# Patient Record
Sex: Female | Born: 1980 | Race: Black or African American | Hispanic: No | Marital: Single | Smoking: Current every day smoker
Health system: Southern US, Community
[De-identification: ages and names within clinical notes are randomized; demographics above are authoritative.]

## PROBLEM LIST (undated history)

## (undated) DIAGNOSIS — E669 Obesity, unspecified: Secondary | ICD-10-CM

## (undated) HISTORY — PX: ABDOMINAL HYSTERECTOMY: SHX81

## (undated) HISTORY — DX: Obesity, unspecified: E66.9

---
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
## 2002-10-04 ENCOUNTER — Emergency Department (HOSPITAL_COMMUNITY): Admission: EM | Admit: 2002-10-04 | Discharge: 2002-10-04 | Payer: Self-pay | Admitting: Emergency Medicine

## 2006-05-01 ENCOUNTER — Emergency Department (HOSPITAL_COMMUNITY): Admission: EM | Admit: 2006-05-01 | Discharge: 2006-05-01 | Payer: Self-pay | Admitting: Emergency Medicine

## 2006-05-31 ENCOUNTER — Emergency Department (HOSPITAL_COMMUNITY): Admission: EM | Admit: 2006-05-31 | Discharge: 2006-05-31 | Payer: Self-pay | Admitting: Emergency Medicine

## 2006-05-31 ENCOUNTER — Emergency Department (HOSPITAL_COMMUNITY): Admission: EM | Admit: 2006-05-31 | Discharge: 2006-05-31 | Payer: Self-pay | Admitting: Family Medicine

## 2006-12-16 ENCOUNTER — Emergency Department (HOSPITAL_COMMUNITY): Admission: EM | Admit: 2006-12-16 | Discharge: 2006-12-16 | Payer: Self-pay | Admitting: Emergency Medicine

## 2007-04-20 ENCOUNTER — Inpatient Hospital Stay (HOSPITAL_COMMUNITY): Admission: AD | Admit: 2007-04-20 | Discharge: 2007-04-20 | Payer: Self-pay | Admitting: Obstetrics

## 2007-04-20 ENCOUNTER — Other Ambulatory Visit: Payer: Self-pay | Admitting: Family Medicine

## 2007-08-06 ENCOUNTER — Inpatient Hospital Stay (HOSPITAL_COMMUNITY): Admission: AD | Admit: 2007-08-06 | Discharge: 2007-08-06 | Payer: Self-pay | Admitting: Obstetrics

## 2007-08-12 ENCOUNTER — Inpatient Hospital Stay (HOSPITAL_COMMUNITY): Admission: AD | Admit: 2007-08-12 | Discharge: 2007-08-12 | Payer: Self-pay | Admitting: Obstetrics

## 2007-08-20 ENCOUNTER — Inpatient Hospital Stay (HOSPITAL_COMMUNITY): Admission: RE | Admit: 2007-08-20 | Discharge: 2007-08-23 | Payer: Self-pay | Admitting: Obstetrics

## 2008-05-23 ENCOUNTER — Emergency Department (HOSPITAL_COMMUNITY): Admission: EM | Admit: 2008-05-23 | Discharge: 2008-05-24 | Payer: Self-pay | Admitting: Emergency Medicine

## 2009-01-23 ENCOUNTER — Emergency Department (HOSPITAL_BASED_OUTPATIENT_CLINIC_OR_DEPARTMENT_OTHER): Admission: EM | Admit: 2009-01-23 | Discharge: 2009-01-23 | Payer: Self-pay | Admitting: Emergency Medicine

## 2009-08-04 ENCOUNTER — Emergency Department (HOSPITAL_BASED_OUTPATIENT_CLINIC_OR_DEPARTMENT_OTHER): Admission: EM | Admit: 2009-08-04 | Discharge: 2009-08-04 | Payer: Self-pay | Admitting: Emergency Medicine

## 2009-08-04 ENCOUNTER — Ambulatory Visit: Payer: Self-pay | Admitting: Diagnostic Radiology

## 2010-09-30 LAB — URINALYSIS, ROUTINE W REFLEX MICROSCOPIC
Glucose, UA: NEGATIVE mg/dL
Protein, ur: NEGATIVE mg/dL
Specific Gravity, Urine: 1.014 (ref 1.005–1.030)
Urobilinogen, UA: 1 mg/dL (ref 0.0–1.0)

## 2010-09-30 LAB — WET PREP, GENITAL
Trich, Wet Prep: NONE SEEN
WBC, Wet Prep HPF POC: NONE SEEN

## 2010-09-30 LAB — PREGNANCY, URINE: Preg Test, Ur: NEGATIVE

## 2010-09-30 LAB — GC/CHLAMYDIA PROBE AMP, GENITAL: Chlamydia, DNA Probe: NEGATIVE

## 2010-10-05 ENCOUNTER — Emergency Department (HOSPITAL_BASED_OUTPATIENT_CLINIC_OR_DEPARTMENT_OTHER)
Admission: EM | Admit: 2010-10-05 | Discharge: 2010-10-05 | Disposition: A | Discharge status: Home / Self Care | Payer: Medicaid Other | Attending: Emergency Medicine | Admitting: Emergency Medicine

## 2010-10-05 DIAGNOSIS — J029 Acute pharyngitis, unspecified: Secondary | ICD-10-CM | POA: Insufficient documentation

## 2010-10-05 DIAGNOSIS — F17200 Nicotine dependence, unspecified, uncomplicated: Secondary | ICD-10-CM | POA: Insufficient documentation

## 2010-10-05 LAB — RAPID STREP SCREEN (MED CTR MEBANE ONLY): Streptococcus, Group A Screen (Direct): NEGATIVE

## 2010-11-07 NOTE — Op Note (Signed)
NAME:  Misty Huber, Misty Huber              ACCOUNT NO.:  [id number]   MEDICAL RECORD NO.:  [id number]          PATIENT TYPE:  INP   LOCATION:  9134                          FACILITY:  [facility]   PHYSICIAN:  Kathreen Cosier, M.D.DATE OF BIRTH:  14-Jan-1981   DATE OF PROCEDURE:  08/20/2007  DATE OF DISCHARGE:  08/12/2007                               OPERATIVE REPORT   PREOPERATIVE DIAGNOSIS:  Previous cesarean section at term, desires  repeat   POSTOPERATIVE DIAGNOSIS:  Previous cesarean section at term, desires  repeat   SURGEON:  Kathreen Cosier, M.D.   FIRST ASSISTANT:  Charles A. Clearance Coots, M.D.   ANESTHESIA:  Spinal.   PROCEDURE:  The patient was placed on the operating table in the supine  position after spinal was administered.  The abdomen was prepped and  draped, the bladder emptied with a Foley catheter.  A transverse  suprapubic incision was made and carried down to the rectus fascia.  The  fascia was cleaned and incised the length of the incision.  The recti  muscles were retracted laterally.  The peritoneum was incised  longitudinally.  A transverse incision was made in the visceral  peritoneum above the bladder and the bladder mobilized inferiorly.  A  transverse lower uterine incision was made, fluid clear.  The patient  delivered from the LOA position of a female, Apgar 9/9, weighing 6  pounds 3 ounces, the team was in attendance.  The placenta was fundal  and removed manually.  The uterine cavity was cleaned with dry laps.  The uterine incision was closed in one layer with continuous suture of  #1 chromic.  Hemostasis was satisfactory.  The bladder flap was  reattached with 2-0 chromic.  The uterus was well contracted.  The tubes  and ovaries were normal.  The abdomen was closed in layers, the  peritoneum with continuous suture of 0 chromic, the fascia with  continuous suture of #1 Dexon, the skin was closed with subcuticular  stitch of 4-0 Monocryl.  Blood loss 800 mL.   The patient tolerated the  procedure well and was taken to the recovery room in good condition.           ______________________________  Kathreen Cosier, M.D.     BAM/MEDQ  D:  08/20/2007  T:  08/20/2007  Job:  [id number]

## 2010-11-10 NOTE — Discharge Summary (Signed)
NAME:  Misty Huber, COSTILOW NO.:  [id number]   MEDICAL RECORD NO.:  [id number]          PATIENT TYPE:  INP   LOCATION:  9134                          FACILITY:  [facility]   PHYSICIAN:  Kathreen Cosier, M.D.DATE OF BIRTH:  10/21/1980   DATE OF ADMISSION:  08/20/2007  DATE OF DISCHARGE:  08/23/2007                               DISCHARGE SUMMARY   The patient is a 30 year old gravida 4, para 1-2-0-2, [facility] August 28, 2007.  She had a C-section in the past and was now at term and desired repeat C-  section.  She had a negative GBS, HIV, and RPR.  The patient underwent  repeat low-transverse cesarean section on August 20, 2007, she had a  female, Apgar 9 and 9, weighing 6 pounds 3 ounces.  Postop, she did  well.  On admission, her hemoglobin was 10.2, postop 9.1; platelet was  274 and 224; white count 10.1 and  9.9.  RPR negative.  HIV negative.  She was discharged on the 3rd postoperative day, ambulatory, on a  regular diet, and Tylox for pain and ferrous sulfate for anemia.   DISCHARGE DIAGNOSIS:  Status post repeat low-transverse cesarean at term  because of previous C-section.           ______________________________  Kathreen Cosier, M.D.     BAM/MEDQ  D:  09/09/2007  T:  09/10/2007  Job:  [id number]

## 2011-03-16 LAB — CBC
HCT: 29.7 — ABNORMAL LOW
MCHC: 34.3
MCHC: 34.4
MCV: 92.8
Platelets: 274
RBC: 2.83 — ABNORMAL LOW
RDW: 14.1
WBC: 10.1
WBC: 9.9

## 2011-03-16 LAB — RPR: RPR Ser Ql: NONREACTIVE

## 2011-04-04 LAB — POCT URINALYSIS DIP (DEVICE)
Glucose, UA: NEGATIVE
Hgb urine dipstick: NEGATIVE
Protein, ur: 30 — AB
Urobilinogen, UA: 2 — ABNORMAL HIGH
pH: 6

## 2011-04-04 LAB — URINALYSIS, ROUTINE W REFLEX MICROSCOPIC
Protein, ur: NEGATIVE
Urobilinogen, UA: 0.2

## 2011-04-11 LAB — POCT PREGNANCY, URINE
Operator id: 235561
Preg Test, Ur: POSITIVE

## 2011-04-11 LAB — POCT URINALYSIS DIP (DEVICE)
Glucose, UA: NEGATIVE
Operator id: 235561
Specific Gravity, Urine: 1.02
Urobilinogen, UA: 1
pH: 6.5

## 2011-04-11 LAB — WET PREP, GENITAL
Trich, Wet Prep: NONE SEEN
Yeast Wet Prep HPF POC: NONE SEEN

## 2011-04-11 LAB — GC/CHLAMYDIA PROBE AMP, GENITAL
Chlamydia, DNA Probe: NEGATIVE
GC Probe Amp, Genital: NEGATIVE

## 2011-11-10 ENCOUNTER — Emergency Department (HOSPITAL_BASED_OUTPATIENT_CLINIC_OR_DEPARTMENT_OTHER): Payer: Medicaid Other

## 2011-11-10 ENCOUNTER — Encounter (HOSPITAL_BASED_OUTPATIENT_CLINIC_OR_DEPARTMENT_OTHER): Payer: Self-pay | Admitting: *Deleted

## 2011-11-10 ENCOUNTER — Emergency Department (HOSPITAL_BASED_OUTPATIENT_CLINIC_OR_DEPARTMENT_OTHER)
Admission: EM | Admit: 2011-11-10 | Discharge: 2011-11-10 | Disposition: A | Discharge status: Home / Self Care | Payer: Medicaid Other | Attending: Emergency Medicine | Admitting: Emergency Medicine

## 2011-11-10 DIAGNOSIS — W2203XA Walked into furniture, initial encounter: Secondary | ICD-10-CM | POA: Insufficient documentation

## 2011-11-10 DIAGNOSIS — S60229A Contusion of unspecified hand, initial encounter: Secondary | ICD-10-CM | POA: Insufficient documentation

## 2011-11-10 MED ORDER — IBUPROFEN 400 MG PO TABS
600.0000 mg | ORAL_TABLET | Freq: Once | ORAL | Status: AC
  Administered 2011-11-10: 600 mg via ORAL
  Filled 2011-11-10: qty 1

## 2011-11-10 NOTE — ED Notes (Signed)
Pt states she hit her right hand on the table. C/O pain to same. Wrapped PTA

## 2011-11-10 NOTE — ED Provider Notes (Signed)
History   This chart was scribed for Suzi Roots, MD by Shari Heritage. The patient was seen in room MHH2/[facility]2. Patient's care was started at 52.     CSN: [id number]  Arrival date & time 11/10/11  3086   First MD Initiated Contact with Patient 11/10/11 2012      Chief Complaint  Patient presents with  . Hand Injury    (Consider location/radiation/quality/duration/timing/severity/associated sxs/prior treatment) The history is provided by the patient. No language interpreter was used.   Misty Huber is a 31 y.o. female who presents to the Emergency Department complaining of moderate, constant pain in right hand with an associated sharp pain that shoots up her right arm after patient hit her hand on a table this evening. Pain worsens with palpation. Patient has a history of fracture in right hand. Patient has no other symptoms. Skin in tact. Pt is ambulatory. No numbness or loss of rom. No other injury.   No fever/chills. No nausea/vomiting. Pt is ambulatory. Patient is current everyday smoker.  History reviewed. No pertinent past medical history.  History reviewed. No pertinent past surgical history.  History reviewed. No pertinent family history.  History  Substance Use Topics  . Smoking status: Current Everyday Smoker  . Smokeless tobacco: Not on file  . Alcohol Use: No    OB History    Grav Para Term Preterm Abortions TAB SAB Ect Mult Living                  Review of Systems  Constitutional: Negative for fever.  Musculoskeletal:       Pain in right hand and sharp pain shooting up right arm.  Skin: Negative for wound.  Neurological: Negative for numbness.    Allergies  Review of patient's allergies indicates not on file.  Home Medications  No current outpatient prescriptions on file.  BP 112/58  Pulse 95  Temp(Src) 99.2 F (37.3 C) (Oral)  Resp 20  Ht 5\' 2"  (1.575 m)  Wt 200 lb (90.719 kg)  BMI 36.58 kg/m2  SpO2 100%  Physical Exam  Nursing  note and vitals reviewed. Constitutional: She appears well-developed and well-nourished.  HENT:  Head: Normocephalic and atraumatic.  Eyes: Conjunctivae and EOM are normal. Pupils are equal, round, and reactive to light.  Neck: Normal range of motion. Neck supple.  Cardiovascular: Normal rate and regular rhythm.   Pulmonary/Chest: Effort normal and breath sounds normal.  Abdominal: Soft. Bowel sounds are normal.  Musculoskeletal: Normal range of motion. She exhibits tenderness (Right hand).       Tenderness lateral aspect right hand. Skin intact. No scaphoid tenderness or wrist tenderness. Normal tendon fxn. Radial pulse 2+. Normal cap refill distally. Normal rom.   Neurological: She is alert.       Hand nvi.   Skin: Skin is warm and dry.  Psychiatric: She has a normal mood and affect.    ED Course  Procedures (including critical care time) DIAGNOSTIC STUDIES: Oxygen Saturation is 100% on room air, normal by my interpretation.    COORDINATION OF CARE: 9:10pm- Patient informed of current plan for treatment and evaluation and agrees with plan at this time.     Labs Reviewed - No data to display Dg Hand Complete Right  11/10/2011  *RADIOLOGY REPORT*  Clinical Data: Injury.  Pain   fifth finger.  RIGHT HAND - COMPLETE 3+ VIEW  Comparison: 05/23/2008.  Findings:  Chronic healed fracture base of the fifth metacarpal. No acute fracture or arthropathy.  IMPRESSION: No acute abnormality.  Original Report Authenticated By: Camelia Phenes, M.D.       MDM  Xrays. Motrin po.  Discussed xrays w pt.      I personally performed the services described in this documentation, which was scribed in my presence. The recorded information has been reviewed and considered. Suzi Roots, MD     Suzi Roots, MD 11/10/11 2130

## 2011-11-10 NOTE — Discharge Instructions (Signed)
Take motrin or aleve as need.  Follow up with primary care doctor in 1-2 weeks if symptoms fail to improve/resolve.  Return if worse, new symptoms, other concern.       Contusion (Bruise) of Hand An injury to the hand may cause bruises (contusions). Contusions are caused by bleeding from small blood vessels (capillaries) that allow blood to leak out into the muscles, tendons, and surrounding soft tissue. This is followed by swelling and pain (inflammation). Contusions of the hand are common because of the use of hands in daily and recreational activities. Signs of a hand injury include pain, swelling, and a color change. Initially the skin may turn blue to purple in color. As the bruise ages, the color turns yellow and orange. Swelling may decrease the movement of the fingers. Contusions are seen more commonly with:  Contact sports (especially in football, wrestling, and basketball).   Use of medications that thin the blood (anticoagulants).   Use of aspirin and nonsteroidal anti-inflammatory agents that decrease the ability of the blood to clot.   Vitamin deficiencies.   Aging.  DIAGNOSIS  Diagnosis of hand injuries can be made by your own observation. If problems continue, a caregiver may be required for further evaluation and treatment. X-rays may be required to make sure there are no broken bones (fractures). Continued problems may require physical therapy for treatment. RISKS AND COMPLICATIONS  Extensive bleeding and tissue inflammation. This can lead to disability and arthritis-type problems later on if the hand does not heal properly.   Infection of the hand if there are breaks in the skin. This is especially true if the hand injury came from someone's teeth, such as would occur with punching someone in the mouth. This can lead to an infection of the tendons and the membranes surrounding the tendons (sheaths). This infection can have severe complications including a loss of function  (a "frozen" hand).   Rupture of the tendons requiring a surgical repair. Failure to repair the tendons can result in loss of function of the hand or fingers.  HOME CARE INSTRUCTIONS   Apply ice to the injury for 15 to 20 minutes, 3 to 4 times per day. Put the ice in a plastic bag and place a towel between the bag of ice and your skin.   An elastic bandage may be used initially for support and to minimize swelling. Do not wrap the hand too tightly. Do not sleep with the elastic bandage on.   Gentle massage from the fingertips towards the elbow will help keep the swelling down. Gently open and close your fist while doing this to maintain range of motion. Do this only after the first few days, when there is no or minimal pain.   Keep your hand above the level of the heart when swelling and pain are present. This will allow the fluid to drain out of the hand, decreasing the amount of swelling. This will improve healing time.   Try to avoid use of the injured hand (except for gentle range of motion) while the hand is hurting. Do not resume use until instructed by your caregiver. Then begin use gradually, do not increase use to the point of pain. If pain does develop, decrease use and continue the above measures, gradually increasing activities that do not cause discomfort until you achieve normal use.   Only take over-the-counter or prescription medicines for pain, discomfort, or fever as directed by your caregiver.   Follow up with your caregiver as  directed. Follow-up care may include orthopedic referrals, physical therapy, and rehabilitation. Any delay in obtaining necessary care could result in delayed healing, or temporary or permanent disability.  REHABILITATION  Begin daily rehabilitation exercises when an elastic bandage is no longer needed and you are either pain free or only have minimal pain.   Use ice massage for 10 minutes before and after workouts. Put ice in a plastic bag and place a  towel between the bag of ice and your skin. Massage the injured area with the ice pack.  SEEK IMMEDIATE MEDICAL CARE IF:   Your pain and swelling increase, or pain is uncontrolled with medications.   You have loss of feeling in your hand, or your hand turns cold or blue.   An oral temperature above 102 F (38.9 C) develops, not controlled by medication.   Your hand becomes warm to the touch, or you have increased pain with even slight movement of your fingers.   Your hand does not begin to improve in 1 or 2 days.   The skin is broken and signs of infection occur (fluid draining from the contusion, increasing pain, fever, headache, muscle aches, dizziness, or a general ill feeling).   You develop new, unexplained problems, or an increase of the symptoms that brought you to your caregiver.  MAKE SURE YOU:   Understand these instructions.   Will watch your condition.   Will get help right away if you are not doing well or get worse.  Document Released: 12/01/2001 Document Revised: 05/31/2011 Document Reviewed: 11/18/2009 [facility] Patient Information 2012 [place], [place].      Cryotherapy Cryotherapy means treatment with cold. Ice or gel packs can be used to reduce both pain and swelling. Ice is the most helpful within the first 24 to 48 hours after an injury or flareup from overusing a muscle or joint. Sprains, strains, spasms, burning pain, shooting pain, and aches can all be eased with ice. Ice can also be used when recovering from surgery. Ice is effective, has very few side effects, and is safe for most people to use. PRECAUTIONS  Ice is not a safe treatment option for people with:  Raynaud's phenomenon. This is a condition affecting small blood vessels in the extremities. Exposure to cold may cause your problems to return.   Cold hypersensitivity. There are many forms of cold hypersensitivity, including:   Cold urticaria. Red, itchy hives appear on the skin when the tissues  begin to warm after being iced.   Cold erythema. This is a red, itchy rash caused by exposure to cold.   Cold hemoglobinuria. Red blood cells break down when the tissues begin to warm after being iced. The hemoglobin that carry oxygen are passed into the urine because they cannot combine with blood proteins fast enough.   Numbness or altered sensitivity in the area being iced.  If you have any of the following conditions, do not use ice until you have discussed cryotherapy with your caregiver:  Heart conditions, such as arrhythmia, angina, or chronic heart disease.   High blood pressure.   Healing wounds or open skin in the area being iced.   Current infections.   Rheumatoid arthritis.   Poor circulation.   Diabetes.  Ice slows the blood flow in the region it is applied. This is beneficial when trying to stop inflamed tissues from spreading irritating chemicals to surrounding tissues. However, if you expose your skin to cold temperatures for too long or without the proper protection, you  can damage your skin or nerves. Watch for signs of skin damage due to cold. HOME CARE INSTRUCTIONS Follow these tips to use ice and cold packs safely.  Place a dry or damp towel between the ice and skin. A damp towel will cool the skin more quickly, so you may need to shorten the time that the ice is used.   For a more rapid response, add gentle compression to the ice.   Ice for no more than 10 to 20 minutes at a time. The bonier the area you are icing, the less time it will take to get the benefits of ice.   Check your skin after 5 minutes to make sure there are no signs of a poor response to cold or skin damage.   Rest 20 minutes or more in between uses.   Once your skin is numb, you can end your treatment. You can test numbness by very lightly touching your skin. The touch should be so light that you do not see the skin dimple from the pressure of your fingertip. When using ice, most people  will feel these normal sensations in this order: cold, burning, aching, and numbness.   Do not use ice on someone who cannot communicate their responses to pain, such as small children or people with dementia.  HOW TO MAKE AN ICE PACK Ice packs are the most common way to use ice therapy. Other methods include ice massage, ice baths, and cryo-sprays. Muscle creams that cause a cold, tingly feeling do not offer the same benefits that ice offers and should not be used as a substitute unless recommended by your caregiver. To make an ice pack, do one of the following:  Place crushed ice or a bag of frozen vegetables in a sealable plastic bag. Squeeze out the excess air. Place this bag inside another plastic bag. Slide the bag into a pillowcase or place a damp towel between your skin and the bag.   Mix 3 parts water with 1 part rubbing alcohol. Freeze the mixture in a sealable plastic bag. When you remove the mixture from the freezer, it will be slushy. Squeeze out the excess air. Place this bag inside another plastic bag. Slide the bag into a pillowcase or place a damp towel between your skin and the bag.  SEEK MEDICAL CARE IF:  You develop white spots on your skin. This may give the skin a blotchy (mottled) appearance.   Your skin turns blue or pale.   Your skin becomes waxy or hard.   Your swelling gets worse.  MAKE SURE YOU:   Understand these instructions.   Will watch your condition.   Will get help right away if you are not doing well or get worse.  Document Released: 02/05/2011 Document Revised: 05/31/2011 Document Reviewed: 02/05/2011 [facility] Patient Information 2012 [place], [place].

## 2011-11-10 NOTE — ED Notes (Signed)
Pt has ice pack for home use

## 2012-03-10 ENCOUNTER — Emergency Department (HOSPITAL_BASED_OUTPATIENT_CLINIC_OR_DEPARTMENT_OTHER)
Admission: EM | Admit: 2012-03-10 | Discharge: 2012-03-10 | Disposition: A | Discharge status: Home / Self Care | Payer: Self-pay | Attending: Emergency Medicine | Admitting: Emergency Medicine

## 2012-03-10 ENCOUNTER — Encounter (HOSPITAL_BASED_OUTPATIENT_CLINIC_OR_DEPARTMENT_OTHER): Payer: Self-pay | Admitting: *Deleted

## 2012-03-10 DIAGNOSIS — Z91040 Latex allergy status: Secondary | ICD-10-CM | POA: Insufficient documentation

## 2012-03-10 DIAGNOSIS — L0390 Cellulitis, unspecified: Secondary | ICD-10-CM

## 2012-03-10 DIAGNOSIS — L02419 Cutaneous abscess of limb, unspecified: Secondary | ICD-10-CM | POA: Insufficient documentation

## 2012-03-10 DIAGNOSIS — Z91013 Allergy to seafood: Secondary | ICD-10-CM | POA: Insufficient documentation

## 2012-03-10 MED ORDER — SULFAMETHOXAZOLE-TMP DS 800-160 MG PO TABS
2.0000 | ORAL_TABLET | Freq: Once | ORAL | Status: AC
  Administered 2012-03-10: 2 via ORAL
  Filled 2012-03-10: qty 2

## 2012-03-10 MED ORDER — CEPHALEXIN 500 MG PO CAPS: 500.0000 mg | ORAL_CAPSULE | Freq: Four times a day (QID) | ORAL | Status: DC

## 2012-03-10 MED ORDER — CEPHALEXIN 250 MG PO CAPS
500.0000 mg | ORAL_CAPSULE | Freq: Once | ORAL | Status: AC
  Administered 2012-03-10: 500 mg via ORAL
  Filled 2012-03-10: qty 2

## 2012-03-10 MED ORDER — OXYCODONE-ACETAMINOPHEN 5-325 MG PO TABS: 1.0000 | ORAL_TABLET | ORAL | Status: DC | PRN

## 2012-03-10 MED ORDER — SULFAMETHOXAZOLE-TRIMETHOPRIM 800-160 MG PO TABS: 2.0000 | ORAL_TABLET | Freq: Two times a day (BID) | ORAL | Status: DC

## 2012-03-10 MED ORDER — OXYCODONE-ACETAMINOPHEN 5-325 MG PO TABS
2.0000 | ORAL_TABLET | Freq: Once | ORAL | Status: AC
  Administered 2012-03-10: 2 via ORAL
  Filled 2012-03-10 (×2): qty 2

## 2012-03-10 NOTE — ED Notes (Signed)
Rash, redness, swelling and pain to her right lower leg since yesterday. Chills.

## 2012-03-10 NOTE — ED Notes (Signed)
Circled the redness on the lower R leg.

## 2012-03-10 NOTE — ED Provider Notes (Signed)
History     CSN: [id number]  Arrival date & time 03/10/12  1156   First MD Initiated Contact with Patient 03/10/12 1307      Chief Complaint  Patient presents with  . Rash    (Consider location/radiation/quality/duration/timing/severity/associated sxs/prior treatment) HPI  31 y.o. female in no acute distress complaining of redness, warmth and swelling to right lower extremity onset yesterday evening. Patient reports chills but denies any fever, nausea or vomiting. Patient cannot recall anything that would've compromised the integrity of the skin.  History reviewed. No pertinent past medical history.  History reviewed. No pertinent past surgical history.  No family history on file.  History  Substance Use Topics  . Smoking status: Current Every Day Smoker -- 0.5 packs/day  . Smokeless tobacco: Not on file  . Alcohol Use: Yes    OB History    Grav Para Term Preterm Abortions TAB SAB Ect Mult Living                  Review of Systems  Constitutional: Negative for fever.  Respiratory: Negative for shortness of breath.   Cardiovascular: Negative for chest pain.  Gastrointestinal: Negative for nausea, vomiting, abdominal pain and diarrhea.  Skin: Positive for rash.  All other systems reviewed and are negative.    Allergies  Shellfish allergy and Latex  Home Medications   Current Outpatient Rx  Name Route Sig Dispense Refill  . ALUM & MAG HYDROXIDE-SIMETH 500-450-40 MG/5ML PO SUSP Oral Take 15 mLs by mouth every 6 (six) hours as needed. For indigestion    . DIPHENHYDRAMINE HCL 25 MG PO TABS Oral Take 25 mg by mouth every 6 (six) hours as needed. For allergies    . ETONOGESTREL 68 MG [place] IMPL Subcutaneous Inject 1 each into the skin once. Implanted 2009    . IBUPROFEN 200 MG PO TABS Oral Take 800 mg by mouth every 8 (eight) hours as needed. For pain      BP 115/76  Pulse 82  Temp 97.9 F (36.6 C) (Oral)  Resp 20  SpO2 100%  Physical Exam  Nursing note and  vitals reviewed. Constitutional: She is oriented to person, place, and time. She appears well-developed and well-nourished. No distress.  HENT:  Head: Normocephalic.  Eyes: Conjunctivae normal and EOM are normal.  Cardiovascular: Normal rate.   Pulmonary/Chest: Effort normal. No stridor.  Abdominal: Soft. Bowel sounds are normal.  Musculoskeletal: Normal range of motion.  Neurological: She is alert and oriented to person, place, and time.  Skin:       6 x 10 cm area of induration to right lower extremity on anterior medial side. Is warm, indurated, and exquisitely tender to palpation. There are no areas of fluctuance.  Psychiatric: She has a normal mood and affect.    ED Course  Procedures (including critical care time)  Labs Reviewed - No data to display No results found.   1. Cellulitis       MDM   31 y.o. -year-old female with cellulitis to the right lower extremity I will start her on Bactrim Keflex and patient verbalized her understanding of return precautions. We'll also have the area of induration and outlined so the patient can track if the redness is expanding.  New Prescriptions   CEPHALEXIN (KEFLEX) 500 MG CAPSULE    Take 1 capsule (500 mg total) by mouth 4 (four) times daily.   OXYCODONE-ACETAMINOPHEN (PERCOCET) 5-325 MG PER TABLET    Take 1 tablet by mouth every  4 (four) hours as needed for pain.   SULFAMETHOXAZOLE-TRIMETHOPRIM (BACTRIM DS) 800-160 MG PER TABLET    Take 2 tablets by mouth 2 (two) times daily.          Wynetta Emery, PA-C 03/10/12 1359

## 2012-03-11 NOTE — ED Provider Notes (Signed)
Medical screening examination/treatment/procedure(s) were performed by non-physician practitioner and as supervising physician I was immediately available for consultation/collaboration.  Mattew Chriswell, MD 03/11/12 0714 

## 2013-04-05 ENCOUNTER — Encounter (HOSPITAL_BASED_OUTPATIENT_CLINIC_OR_DEPARTMENT_OTHER): Payer: Self-pay | Admitting: Emergency Medicine

## 2013-04-05 ENCOUNTER — Emergency Department (HOSPITAL_BASED_OUTPATIENT_CLINIC_OR_DEPARTMENT_OTHER)
Admission: EM | Admit: 2013-04-05 | Discharge: 2013-04-05 | Disposition: A | Discharge status: Home / Self Care | Payer: Medicaid Other | Attending: Emergency Medicine | Admitting: Emergency Medicine

## 2013-04-05 DIAGNOSIS — R1110 Vomiting, unspecified: Secondary | ICD-10-CM | POA: Insufficient documentation

## 2013-04-05 DIAGNOSIS — Z91040 Latex allergy status: Secondary | ICD-10-CM | POA: Insufficient documentation

## 2013-04-05 DIAGNOSIS — R509 Fever, unspecified: Secondary | ICD-10-CM | POA: Insufficient documentation

## 2013-04-05 DIAGNOSIS — F17200 Nicotine dependence, unspecified, uncomplicated: Secondary | ICD-10-CM | POA: Insufficient documentation

## 2013-04-05 DIAGNOSIS — Z3202 Encounter for pregnancy test, result negative: Secondary | ICD-10-CM | POA: Insufficient documentation

## 2013-04-05 DIAGNOSIS — J329 Chronic sinusitis, unspecified: Secondary | ICD-10-CM | POA: Insufficient documentation

## 2013-04-05 LAB — URINALYSIS, ROUTINE W REFLEX MICROSCOPIC
Protein, ur: NEGATIVE mg/dL
Specific Gravity, Urine: 1.025 (ref 1.005–1.030)
Urobilinogen, UA: 1 mg/dL (ref 0.0–1.0)

## 2013-04-05 LAB — URINE MICROSCOPIC-ADD ON

## 2013-04-05 MED ORDER — BENZONATATE 100 MG PO CAPS
100.0000 mg | ORAL_CAPSULE | Freq: Once | ORAL | Status: AC
  Administered 2013-04-05: 100 mg via ORAL
  Filled 2013-04-05: qty 1

## 2013-04-05 MED ORDER — ONDANSETRON 4 MG PO TBDP
4.0000 mg | ORAL_TABLET | Freq: Once | ORAL | Status: AC
  Administered 2013-04-05: 4 mg via ORAL
  Filled 2013-04-05: qty 1

## 2013-04-05 MED ORDER — KETOROLAC TROMETHAMINE 30 MG/ML IJ SOLN
30.0000 mg | Freq: Once | INTRAMUSCULAR | Status: DC
  Filled 2013-04-05: qty 1

## 2013-04-05 MED ORDER — KETOROLAC TROMETHAMINE 60 MG/2ML IM SOLN
60.0000 mg | Freq: Once | INTRAMUSCULAR | Status: AC
  Administered 2013-04-05: 60 mg via INTRAMUSCULAR
  Filled 2013-04-05: qty 2

## 2013-04-05 MED ORDER — SODIUM CHLORIDE 0.9 % IV BOLUS (SEPSIS)
1000.0000 mL | Freq: Once | INTRAVENOUS | Status: AC
  Administered 2013-04-05: 1000 mL via INTRAVENOUS

## 2013-04-05 MED ORDER — IBUPROFEN 800 MG PO TABS: 800.0000 mg | ORAL_TABLET | Freq: Three times a day (TID) | ORAL | Status: DC

## 2013-04-05 NOTE — ED Provider Notes (Signed)
CSN: [id number]     Arrival date & time 04/05/13  1348 History   First MD Initiated Contact with Patient 04/05/13 1400     Chief Complaint  Patient presents with  . Nasal Congestion   (Consider location/radiation/quality/duration/timing/severity/associated sxs/prior Treatment) Patient is a 32 y.o. female presenting with URI. The history is provided by the patient.  URI Presenting symptoms: congestion, fever (101 yesterday, none today) and rhinorrhea   Presenting symptoms: no cough and no ear pain   Congestion:    Location:  Nasal   Interferes with sleep: yes     Interferes with eating/drinking: no   Severity:  Moderate Onset quality:  Gradual Timing:  Constant Progression:  Worsening Chronicity:  New Relieved by:  Nothing Worsened by:  Nothing tried Ineffective treatments:  None tried Associated symptoms: no arthralgias and no headaches   Risk factors: sick contacts (daughter had this last week)   Risk factors: not elderly, no chronic cardiac disease and no chronic kidney disease     History reviewed. No pertinent past medical history. History reviewed. No pertinent past surgical history. No family history on file. History  Substance Use Topics  . Smoking status: Current Every Day Smoker -- 0.50 packs/day  . Smokeless tobacco: Not on file  . Alcohol Use: Yes   OB History   Grav Para Term Preterm Abortions TAB SAB Ect Mult Living                 Review of Systems  Constitutional: Positive for fever (101 yesterday, none today) and chills.  HENT: Positive for congestion and rhinorrhea. Negative for ear discharge, ear pain, tinnitus and trouble swallowing.   Respiratory: Negative for cough and shortness of breath.   Cardiovascular: Negative for chest pain and leg swelling.  Gastrointestinal: Positive for vomiting (yesterday, none today). Negative for abdominal pain.  Musculoskeletal: Negative for arthralgias.  Neurological: Negative for headaches.  All other systems  reviewed and are negative.    Allergies  Shellfish allergy and Latex  Home Medications   Current Outpatient Rx  Name  Route  Sig  Dispense  Refill  . aluminum & magnesium hydroxide-simethicone (MYLANTA) 500-450-40 MG/5ML suspension   Oral   Take 15 mLs by mouth every 6 (six) hours as needed. For indigestion         . diphenhydrAMINE (BENADRYL) 25 MG tablet   Oral   Take 25 mg by mouth every 6 (six) hours as needed. For allergies         . etonogestrel (IMPLANON) 68 MG IMPL implant   Subcutaneous   Inject 1 each into the skin once. Implanted 2009         . ibuprofen (ADVIL,MOTRIN) 200 MG tablet   Oral   Take 800 mg by mouth every 8 (eight) hours as needed. For pain         . oxyCODONE-acetaminophen (PERCOCET) 5-325 MG per tablet   Oral   Take 1 tablet by mouth every 4 (four) hours as needed for pain.   12 tablet   0    BP 111/49  Pulse 80  Temp(Src) 99.2 F (37.3 C) (Oral)  Resp 18  SpO2 100% Physical Exam  Nursing note and vitals reviewed. Constitutional: She is oriented to person, place, and time. She appears well-developed and well-nourished. No distress.  HENT:  Head: Normocephalic and atraumatic.  Nose: Right sinus exhibits maxillary sinus tenderness and frontal sinus tenderness. Left sinus exhibits maxillary sinus tenderness and frontal sinus tenderness.  Eyes: EOM  are normal. Pupils are equal, round, and reactive to light.  Neck: Normal range of motion. Neck supple.  Cardiovascular: Normal rate and regular rhythm.  Exam reveals no friction rub.   No murmur heard. Pulmonary/Chest: Effort normal and breath sounds normal. No respiratory distress. She has no wheezes. She has no rales.  Abdominal: Soft. She exhibits no distension. There is no tenderness. There is no rebound.  Musculoskeletal: Normal range of motion. She exhibits no edema.  Neurological: She is alert and oriented to person, place, and time.  Skin: She is not diaphoretic.    ED Course   Procedures (including critical care time) Labs Review Labs Reviewed - No data to display Imaging Review No results found.  EKG Interpretation   None       MDM   1. Sinusitis    32 year old female with no medical history presents with symptoms concerning for sinusitis. She's had a fever yesterday with one episode of vomiting yesterday with nasal congestion, headache with some earaches. She's also had some associated chills. Her daughter had similar symptoms this last week. Vitals are stable here. Patient has normal tympanic membranes, diffuse sinus tenderness to palpation, normal pharynx. No lymphadenopathy in the cervical region. Her neck is supple without signs of meningitis. Her lungs are clear. Her belly is soft. I believe patient has sinusitis. I offered IV fluids for relief, she agreed to that. We did lose IV however, she is unable to give fluids. IM Toradol given. Will hold antibiotics as the symptoms have been present for 4 days. Instructed to continue supportive care. Patient had a urine drawn, however it is heavily contaminated with many squamous cells so we will not treat her UTI.   Dagmar Hait, MD 04/05/13 [phone]

## 2013-04-05 NOTE — ED Notes (Signed)
Patient here with 3 days of congestion, headache, earache , no acute distress

## 2013-04-05 NOTE — ED Notes (Signed)
Pt given rx x 1 for ibuprofen-

## 2016-06-25 DIAGNOSIS — M797 Fibromyalgia: Secondary | ICD-10-CM | POA: Insufficient documentation

## 2016-06-25 HISTORY — DX: Fibromyalgia: M79.7

## 2016-09-24 DIAGNOSIS — Z79899 Other long term (current) drug therapy: Secondary | ICD-10-CM | POA: Insufficient documentation

## 2016-09-24 DIAGNOSIS — M0579 Rheumatoid arthritis with rheumatoid factor of multiple sites without organ or systems involvement: Secondary | ICD-10-CM | POA: Insufficient documentation

## 2016-09-24 DIAGNOSIS — M791 Myalgia, unspecified site: Secondary | ICD-10-CM | POA: Insufficient documentation

## 2018-12-01 ENCOUNTER — Other Ambulatory Visit: Payer: Self-pay

## 2018-12-11 ENCOUNTER — Encounter: Payer: Self-pay | Admitting: Internal Medicine

## 2018-12-11 ENCOUNTER — Ambulatory Visit: Payer: Self-pay | Admitting: Internal Medicine

## 2018-12-11 ENCOUNTER — Other Ambulatory Visit: Payer: Self-pay

## 2018-12-11 VITALS — BP 118/62 | HR 86 | Resp 12 | Ht 62.5 in | Wt 184.0 lb

## 2018-12-11 DIAGNOSIS — N921 Excessive and frequent menstruation with irregular cycle: Secondary | ICD-10-CM

## 2018-12-11 DIAGNOSIS — E669 Obesity, unspecified: Secondary | ICD-10-CM | POA: Insufficient documentation

## 2018-12-11 MED ORDER — NORETHINDRONE 0.35 MG PO TABS: 1.0000 | ORAL_TABLET | Freq: Every day | ORAL | 6 refills | Status: AC

## 2018-12-11 MED ORDER — MEDROXYPROGESTERONE ACETATE 10 MG PO TABS: ORAL_TABLET | ORAL | 0 refills | Status: AC

## 2018-12-11 NOTE — Progress Notes (Signed)
Subjective:    Patient ID: Misty Huber, female   DOB: October 08, 1980, 38 y.o.   MRN: [id number]   HPI   Here to establish.  Has had heavy, irregular and often prolonged periods since had Implanon removed about 1.5 years or more ago.   This was removed to see if her fibromyalgia pain would improve.  She had also gained weight with the implant.   She was about 230 with the implant as well and is now down to 184. Since she had the implant removed, she lost her insurance to be seen by her OB/GYN.  She has only been seen for this once on 11/29/2018 at [facility].   She was found to have hgb of 9.7 in the ED down from 13.1 in 2016. Urine HCG negative. No pelvic exam done. She was prescribed medroxyprogesterone 10 mg daily after she could not afford the original Tranexamic acid written on the day she was seen and filled on 12/01/2018.  She took 1 dose of the progesterone and the bleeding stopped.  She took one more day, then stopped and took pills on and off.  Has 2 tabs left. Has never been told she has fibroids.   Current Meds  Medication Sig  . diphenhydrAMINE (BENADRYL) 25 MG tablet Take 25 mg by mouth every 6 (six) hours as needed. For allergies  . ibuprofen (ADVIL,MOTRIN) 200 MG tablet Take 800 mg by mouth every 8 (eight) hours as needed. For pain  . medroxyPROGESTERone (PROVERA) 10 MG tablet 1 daily for 10 days   Allergies  Allergen Reactions  . Shellfish Allergy Anaphylaxis  . Latex Itching   Past Medical History:  Diagnosis Date  . Fibromyalgia 2018   Has had pain for years with fatigue but not diagnosed until year before implant out.    . Obesity (BMI 30-39.9)    Past Surgical History:  Procedure Laterality Date  . cervical cryosurgery  1997  . CESAREAN SECTION  2002, 2009   Family History  Problem Relation Age of Onset  . Alcohol abuse Mother   . HIV/AIDS Father        from IV drug use.    Social History   Socioeconomic History  . Marital status:  Single    Spouse name: Not on file  . Number of children: 3  . Years of education: Not on file  . Highest education level: Associate degree: occupational, Scientist, product/process developmenttechnical, or vocational program  Occupational History  . Occupation: Human resources officerweepstakes store manager    Comment: Currently closed during pandemic, has applied for unemployment.  Social Needs  . Financial resource strain: Hard  . Food insecurity    Worry: Sometimes true    Inability: Sometimes true  . Transportation needs    Medical: No    Non-medical: No  Tobacco Use  . Smoking status: Current Every Day Smoker    Packs/day: 0.50    Types: Cigarettes  . Smokeless tobacco: Never Used  Substance and Sexual Activity  . Alcohol use: Yes    Comment: Occasional  . Drug use: No  . Sexual activity: Yes    Birth control/protection: None  Lifestyle  . Physical activity    Days per week: Not on file    Minutes per session: Not on file  . Stress: Rather much  Relationships  . Social Musicianconnections    Talks on phone: Not on file    Gets together: Not on file    Attends religious service: Not on file  Active member of club or organization: Not on file    Attends meetings of clubs or organizations: Not on file    Relationship status: Not on file  . Intimate partner violence    Fear of current or ex partner: Not on file    Emotionally abused: Not on file    Physically abused: Not on file    Forced sexual activity: Not on file  Other Topics Concern  . Not on file  Social History Narrative   Lives in [place] with youngest 2 children     Review of Systems    Objective:   BP 118/62 (BP Location: Left Arm, Patient Position: Sitting, Cuff Size: Normal)   Pulse 86   Resp 12   Ht 5' 2.5" (1.588 m)   Wt 184 lb (83.5 kg)   LMP 12/09/2018   BMI 33.12 kg/m   Physical Exam  NAD HEENT:  PERRL, EOMI Neck:  Supple, No adenopathy, no thyromegaly Chest:  CTA CV:  RRR with normal S1 and S2, No S3, S4 or murmur.  Radial and DP  pulses normal and equal. Abd:  S, NT, No HSM or mass, + GS GU:  Normal external female genitalia.  No uterine or adnexal mass or tenderness.  No cervical lesion.  No CMT    Assessment & Plan  Menometrorrhagia with anemia:  Total of 10 day course of Medroxyprogesterone 10 mg daily. Then start Micronor on day 11. Call if bleeding does not stop with the Medroxyprogesterone.   Ferrous Gluconate 324 mg twice daily. Follow up in 1 month Discussed consideration of IUD placement at [facility] to avoid weight gain.

## 2018-12-11 NOTE — Patient Instructions (Addendum)
Use condoms with intercourse for the first month of you Birth Control pills Look into possibility of Mirena IUD so not taking hormone systemically and possibly gaining weight. Use your 2 tabs of medroxy progesterone from previous fill and then take the 8 tabs (one tab daily) from new prescription of the same for a total of 10 days. On day 11, start the Norethindrone daily--never skip Call if your bleeding continues to be heavy--if stops or slows down to a manageable level, no need to call until planned follow up.  Ferrous gluconate (iron pills) 324 mg twice daily with half and orange for anemia

## 2018-12-12 LAB — POCT URINE PREGNANCY: Preg Test, Ur: NEGATIVE

## 2019-01-12 ENCOUNTER — Ambulatory Visit: Payer: Self-pay | Admitting: Internal Medicine

## 2019-01-13 ENCOUNTER — Ambulatory Visit: Payer: Self-pay | Admitting: Internal Medicine

## 2019-02-16 DIAGNOSIS — D649 Anemia, unspecified: Secondary | ICD-10-CM | POA: Insufficient documentation

## 2019-10-22 DIAGNOSIS — F17200 Nicotine dependence, unspecified, uncomplicated: Secondary | ICD-10-CM | POA: Insufficient documentation

## 2021-07-24 NOTE — Progress Notes (Deleted)
Office Visit Note  Patient: Misty Huber             Date of Birth: [birth year]           MRN: [id number]             PCP: Mack Hook, MD Referring: Wyvonne Lenz, PA-C Visit Date: 07/25/2021 Occupation: @GUAROCC @  Subjective:  No chief complaint on file.   History of Present Illness: Misty Huber is a 41 y.o. female here for seropositive RA. She was seen by [facility] rheumatology in 2018 and diagnosed with initial treatment recommendation of methotrexate and prednisone taper. Symptoms at the time included pain in bilateral hands and feet, several hours morning stiffness, with some MCP and PIP joint swelling. She did not appear to follow up and was dismissed from their clinic.***   Labs reviewed 09/2016 HBV neg HCV neg  07/2016 RA 15.9 CCP neg  Activities of Daily Living:  Patient reports morning stiffness for *** {minute/hour:19697}.   Patient {ACTIONS;DENIES/REPORTS:21021675::"Denies"} nocturnal pain.  Difficulty dressing/grooming: {ACTIONS;DENIES/REPORTS:21021675::"Denies"} Difficulty climbing stairs: {ACTIONS;DENIES/REPORTS:21021675::"Denies"} Difficulty getting out of chair: {ACTIONS;DENIES/REPORTS:21021675::"Denies"} Difficulty using hands for taps, buttons, cutlery, and/or writing: {ACTIONS;DENIES/REPORTS:21021675::"Denies"}  No Rheumatology ROS completed.   PMFS History:  Patient Active Problem List   Diagnosis Date Noted   Menometrorrhagia 12/11/2018   Obesity (BMI 30-39.9)    Fibromyalgia 2018    Past Medical History:  Diagnosis Date   Fibromyalgia 2018   Has had pain for years with fatigue but not diagnosed until year before implant out.     Obesity (BMI 30-39.9)     Family History  Problem Relation Age of Onset   Alcohol abuse Mother    HIV/AIDS Father        from IV drug use.   Past Surgical History:  Procedure Laterality Date   cervical cryosurgery  1997   CESAREAN SECTION  2002, 2009   Social History   Social History Narrative    Lives in [place] with youngest 2 children    There is no immunization history on file for this patient.   Objective: Vital Signs: There were no vitals taken for this visit.   Physical Exam   Musculoskeletal Exam: ***  CDAI Exam: CDAI Score: -- Patient Global: --; Provider Global: -- Swollen: --; Tender: -- Joint Exam 07/25/2021   No joint exam has been documented for this visit   There is currently no information documented on the homunculus. Go to the Rheumatology activity and complete the homunculus joint exam.  Investigation: No additional findings.  Imaging: No results found.  Recent Labs: Lab Results  Component Value Date   WBC 9.9 08/21/2007   HGB 9.1 (L) 08/21/2007   PLT 224 08/21/2007    Speciality Comments: No specialty comments available.  Procedures:  No procedures performed Allergies: Shellfish allergy and Latex   Assessment / Plan:     Visit Diagnoses: No diagnosis found.  Orders: No orders of the defined types were placed in this encounter.  No orders of the defined types were placed in this encounter.   Face-to-face time spent with patient was *** minutes. Greater than 50% of time was spent in counseling and coordination of care.  Follow-Up Instructions: No follow-ups on file.   Collier Salina, MD  Note - This record has been created using [employer].  Chart creation errors have been sought, but may not always  have been located. Such creation errors do not reflect on  the standard of medical care.

## 2021-07-25 ENCOUNTER — Ambulatory Visit: Payer: Medicaid Other | Admitting: Internal Medicine

## 2021-08-18 ENCOUNTER — Ambulatory Visit: Payer: Self-pay | Admitting: Internal Medicine

## 2021-08-23 ENCOUNTER — Ambulatory Visit: Payer: Medicaid Other | Admitting: Internal Medicine

## 2021-08-23 ENCOUNTER — Other Ambulatory Visit: Payer: Self-pay

## 2021-08-23 ENCOUNTER — Encounter: Payer: Self-pay | Admitting: Internal Medicine

## 2021-08-23 VITALS — BP 118/68 | HR 74 | Resp 15 | Ht 62.0 in | Wt 167.0 lb

## 2021-08-23 DIAGNOSIS — Z79899 Other long term (current) drug therapy: Secondary | ICD-10-CM | POA: Diagnosis not present

## 2021-08-23 DIAGNOSIS — M722 Plantar fascial fibromatosis: Secondary | ICD-10-CM

## 2021-08-23 DIAGNOSIS — M797 Fibromyalgia: Secondary | ICD-10-CM

## 2021-08-23 DIAGNOSIS — M0579 Rheumatoid arthritis with rheumatoid factor of multiple sites without organ or systems involvement: Secondary | ICD-10-CM

## 2021-08-23 NOTE — Progress Notes (Signed)
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 female here for rheumatoid arthritis with positive RF. She was previously seen by Dr. Posey Pronto with original diagnosis in 2018 with right hand and elbow synovitis. She has also had joint pain in multiple areas and tender points consistent with fibromyalgia. She has had ongoing pain varying in intensity for about 5 years now since that time. Symptoms are particularly worse in the hips, knees, and feet right foot is the worst with frequent swelling around the ball of the foot. Morning stiffness for about 30-40 minutes after waking. Symptoms are particularly worse now in the past year and she is out of work unable to tolerated standing and walking all day and also typing due to hand pain. She notices occasionally dropping items unintentionally and grip strength is poor. Her legs also seem to lock up to partially give way unexpectedly but not causing any falls. She feels lightheadedness occasionally.  She is going for a sleep study to evaluate her fatigue and excessive sleepiness. She stopped taking methotrexate which caused abdominal pain, diarrhea, fatigue, and headaches not sure if it didn't work or just quit too early. She takes tylenol and ibuprofen as needed and uses turmeric as a dietary supplement for inflammation.  Labs reviewed 2018 RF 15.9 ANA neg CCP neg ESR 16 CRP 10.9  Activities of Daily Living:  Patient reports morning stiffness for 1 hour.   Patient Reports nocturnal pain.  Difficulty dressing/grooming: Reports Difficulty  climbing stairs: Reports Difficulty getting out of chair: Reports Difficulty using hands for taps, buttons, cutlery, and/or writing: Reports  Review of Systems  Constitutional:  Positive for fatigue.  HENT:  Positive for mouth dryness.   Eyes:  Positive for dryness.  Respiratory:  Positive for shortness of breath.   Cardiovascular:  Positive for swelling in legs/feet.  Gastrointestinal:  Positive for constipation and diarrhea.  Endocrine: Positive for cold intolerance and heat intolerance.  Genitourinary:  Negative for difficulty urinating.  Musculoskeletal:  Positive for joint pain, gait problem, joint pain, joint swelling, muscle weakness, morning stiffness and muscle tenderness.  Skin:  Negative for rash.  Allergic/Immunologic: Negative for susceptible to infections.  Neurological:  Positive for numbness and weakness.  Hematological:  Positive for bruising/bleeding tendency.  Psychiatric/Behavioral:  Positive for sleep disturbance.    PMFS History:  Patient Active Problem List   Diagnosis Date Noted   Plantar fasciitis of right foot 08/23/2021   Nicotine dependence 10/22/2019   Anemia 02/16/2019   Menometrorrhagia 12/11/2018   Obesity (BMI 30-39.9)    Rheumatoid arthritis involving multiple sites with positive rheumatoid factor ([place]) 09/24/2016   Myalgia 09/24/2016   Long-term use of high-risk medication 09/24/2016   Fibromyalgia 2018    Past Medical History:  Diagnosis Date   Fibromyalgia 2018   Has had pain for years with fatigue but not diagnosed until year before implant out.     Obesity (BMI 30-39.9)  Family History  Problem Relation Age of Onset   Alcohol abuse Mother    HIV/AIDS Father        from IV drug use.   Past Surgical History:  Procedure Laterality Date   ABDOMINAL HYSTERECTOMY     cervical cryosurgery  1997   CESAREAN SECTION  2002, 2009   Social History   Social History Narrative   Lives in [place] with youngest 2 children    Immunization History  Administered Date(s) Administered   Tdap 03/19/2015     Objective: Vital Signs: BP 118/68 (BP Location: Right Arm, Patient Position: Sitting, Cuff Size: Normal)    Pulse 74    Resp 15    Ht 5' 2"  (1.575 m)    Wt 167 lb (75.8 kg)    LMP 12/09/2018    BMI 30.54 kg/m    Physical Exam HENT:     Right Ear: External ear normal.     Left Ear: External ear normal.     Mouth/Throat:     Mouth: Mucous membranes are moist.     Pharynx: Oropharynx is clear.  Eyes:     Conjunctiva/sclera: Conjunctivae normal.  Neck:     Comments: Multiple <1cm diameter nontender right cervical lymph nodes Cardiovascular:     Rate and Rhythm: Normal rate and regular rhythm.  Pulmonary:     Effort: Pulmonary effort is normal.     Comments: Bilateral expiratory rhonchi, good air movement Lymphadenopathy:     Cervical: Cervical adenopathy present.  Skin:    General: Skin is warm and dry.  Neurological:     Mental Status: She is alert.  Psychiatric:        Mood and Affect: Mood normal.     Musculoskeletal Exam:  Neck full ROM no tenderness Shoulders full ROM no tenderness or swelling Elbows full ROM no tenderness or swelling Wrists full ROM no tenderness or swelling Fingers full ROM no tenderness or swelling Bilateral low back tenderness to pressure over sacrum and SI joints, lateral hip tenderness to pressure, posterior pain provoked with internal rotation and FADIR Knees full ROM no tenderness or swelling Ankles full ROM right slight tenderness pain on bottom of heel extending along sole of foot, mild swelling and tenderness ar right 1st MTP some pain at dorsal midfoot   CDAI Exam: CDAI Score: 9  Patient Global: 60 mm; Provider Global: 30 mm Swollen: 1 ; Tender: 6  Joint Exam 08/23/2021      Right  Left  Sacroiliac   Tender   Tender  Hip   Tender   Tender  Subtalar   Tender     MTP 1  Swollen Tender        Investigation: No additional findings.  Imaging: No  results found.  Recent Labs: Lab Results  Component Value Date   WBC 8.1 08/23/2021   HGB 13.4 08/23/2021   PLT 240 08/23/2021    Speciality Comments: No specialty comments available.  Procedures:  No procedures performed Allergies: Latex and Shellfish allergy   Assessment / Plan:     Visit Diagnoses: Rheumatoid arthritis involving multiple sites with positive rheumatoid factor ([place]) - Plan: Rheumatoid factor, Sedimentation rate, C-reactive protein, hydroxychloroquine (PLAQUENIL) 200 MG tablet  Joint pain in multiple areas previously low positive rheumatoid factor and nonresponder to methotrexate which was tolerated for only very short time.  We will recheck this today also checking inflammatory markers.  Discussed pain in the low back and hips and great toes not a  typical distribution for RA inflammation.  If labs positive recommend trial of hydroxychloroquine.  Fibromyalgia  Somewhat generalized pain over her muscles and joints but worst in the low back in sacrum and hips area.  If lab work-up for markers of RA is negative would recommend trial more for fibromyalgia pain such as SNRI or muscle relaxant medication.  Could still be a big component even if there is RA with somewhat low disease activity.  Long-term use of high-risk medication - Plan: CBC with Differential/Platelet, Ambulatory referral to Ophthalmology  Planning to start hydroxychloroquine for RA checking CBC today.  Discussed risks of medication including intolerance and need for long-term retinal toxicity screening.  Refer to ophthalmology today.  Plantar fasciitis of right foot - Plan: Ambulatory referral to Podiatry  Pain on the plantar surface is small enthesophyte on November x-ray looks consistent with plantar fasciitis.  However also having some pain in the midfoot occasionally radiating to the ankle and calf she is interested in seeing a foot or ankle expert for further recommendations will refer to podiatry  today.  Orders: Orders Placed This Encounter  Procedures   Rheumatoid factor   Sedimentation rate   C-reactive protein   CBC with Differential/Platelet   Ambulatory referral to Ophthalmology   Ambulatory referral to Podiatry   Meds ordered this encounter  Medications   hydroxychloroquine (PLAQUENIL) 200 MG tablet    Sig: Take 1 tablet (200 mg total) by mouth daily.    Dispense:  30 tablet    Refill:  2    Follow-Up Instructions: Return in about 2 months (around 10/23/2021) for New pt ?RA HCQ start f/u 13mo.   CCollier Salina MD  Note - This record has been created using [employer]  Chart creation errors have been sought, but may not always  have been located. Such creation errors do not reflect on  the standard of medical care.

## 2021-08-23 NOTE — Patient Instructions (Signed)
Hydroxychloroquine Tablets °What is this medication? °HYDROXYCHLOROQUINE (hye drox ee KLOR oh kwin) treats autoimmune conditions, such as rheumatoid arthritis and lupus. It works by slowing down an overactive immune system. It may also be used to prevent and treat malaria. It works by killing the parasite that causes malaria. It belongs to a group of medications called DMARDs. °This medicine may be used for other purposes; ask your health care provider or pharmacist if you have questions. °COMMON BRAND NAME(S): Plaquenil, Quineprox °What should I tell my care team before I take this medication? °They need to know if you have any of these conditions: °Diabetes °Eye disease, vision problems °G6PD deficiency °Heart disease °History of irregular heartbeat °If you often drink alcohol °Kidney disease °Liver disease °Porphyria °Psoriasis °An unusual or allergic reaction to chloroquine, hydroxychloroquine, other medications, foods, dyes, or preservatives °Pregnant or trying to get pregnant °Breast-feeding °How should I use this medication? °Take this medication by mouth with a glass of water. Take it as directed on the prescription label. Do not cut, crush or chew this medication. Swallow the tablets whole. Take it with food. Do not take it more than directed. Take all of this medication unless your care team tells you to stop it early. Keep taking it even if you think you are better. °Take products with antacids in them at a different time of day than this medication. Take this medication 4 hours before or 4 hours after antacids. Talk to your care team if you have questions. °Talk to your care team about the use of this medication in children. While this medication may be prescribed for selected conditions, precautions do apply. °Overdosage: If you think you have taken too much of this medicine contact a poison control center or emergency room at once. °NOTE: This medicine is only for you. Do not share this medicine with  others. °What if I miss a dose? °If you miss a dose, take it as soon as you can. If it is almost time for your next dose, take only that dose. Do not take double or extra doses. °What may interact with this medication? °Do not take this medication with any of the following: °Cisapride °Dronedarone °Pimozide °Thioridazine °This medication may also interact with the following: °Ampicillin °Antacids °Cimetidine °Cyclosporine °Digoxin °Kaolin °Medications for diabetes, like insulin, glipizide, glyburide °Medications for seizures like carbamazepine, phenobarbital, phenytoin °Mefloquine °Methotrexate °Other medications that prolong the QT interval (cause an abnormal heart rhythm) °Praziquantel °This list may not describe all possible interactions. Give your health care provider a list of all the medicines, herbs, non-prescription drugs, or dietary supplements you use. Also tell them if you smoke, drink alcohol, or use illegal drugs. Some items may interact with your medicine. °What should I watch for while using this medication? °Visit your care team for regular checks on your progress. Tell your care team if your symptoms do not start to get better or if they get worse. °You may need blood work done while you are taking this medication. If you take other medications that can affect heart rhythm, you may need more testing. Talk to your care team if you have questions. °Your vision may be tested before and during use of this medication. Tell your care team right away if you have any change in your eyesight. °This medication may cause serious skin reactions. They can happen weeks to months after starting the medication. Contact your care team right away if you notice fevers or flu-like symptoms with a rash. The   rash may be red or purple and then turn into blisters or peeling of the skin. Or, you might notice a red rash with swelling of the face, lips or lymph nodes in your neck or under your arms. °If you or your family  notice any changes in your behavior, such as new or worsening depression, thoughts of harming yourself, anxiety, or other unusual or disturbing thoughts, or memory loss, call your care team right away. °What side effects may I notice from receiving this medication? °Side effects that you should report to your care team as soon as possible: °Allergic reactions--skin rash, itching, hives, swelling of the face, lips, tongue, or throat °Aplastic anemia--unusual weakness or fatigue, dizziness, headache, trouble breathing, increased bleeding or bruising °Change in vision °Heart rhythm changes--fast or irregular heartbeat, dizziness, feeling faint or lightheaded, chest pain, trouble breathing °Infection--fever, chills, cough, or sore throat °Low blood sugar (hypoglycemia)--tremors or shaking, anxiety, sweating, cold or clammy skin, confusion, dizziness, rapid heartbeat °Muscle injury--unusual weakness or fatigue, muscle pain, dark yellow or brown urine, decrease in amount of urine °Pain, tingling, or numbness in the hands or feet °Rash, fever, and swollen lymph nodes °Redness, blistering, peeling, or loosening of the skin, including inside the mouth °Thoughts of suicide or self-harm, worsening mood, or feelings of depression °Unusual bruising or bleeding °Side effects that usually do not require medical attention (report to your care team if they continue or are bothersome): °Diarrhea °Headache °Nausea °Stomach pain °Vomiting °This list may not describe all possible side effects. Call your doctor for medical advice about side effects. You may report side effects to FDA at 1-800-FDA-1088. °Where should I keep my medication? °Keep out of the reach of children and pets. °Store at room temperature up to 30 degrees C (86 degrees F). Protect from light. Get rid of any unused medication after the expiration date. °To get rid of medications that are no longer needed or have expired: °Take the medication to a medication take-back  program. Check with your pharmacy or law enforcement to find a location. °If you cannot return the medication, check the label or package insert to see if the medication should be thrown out in the garbage or flushed down the toilet. If you are not sure, ask your care team. If it is safe to put it in the trash, empty the medication out of the container. Mix the medication with cat litter, dirt, coffee grounds, or other unwanted substance. Seal the mixture in a bag or container. Put it in the trash. °NOTE: This sheet is a summary. It may not cover all possible information. If you have questions about this medicine, talk to your doctor, pharmacist, or health care provider. °© 2022 [employer] (2020-10-27 00:00:00) ° °

## 2021-08-24 LAB — CBC WITH DIFFERENTIAL/PLATELET
Absolute Monocytes: 737 cells/uL (ref 200–950)
Basophils Absolute: 32 cells/uL (ref 0–200)
Basophils Relative: 0.4 %
Eosinophils Absolute: 49 cells/uL (ref 15–500)
Eosinophils Relative: 0.6 %
HCT: 40.8 % (ref 35.0–45.0)
Hemoglobin: 13.4 g/dL (ref 11.7–15.5)
Lymphs Abs: 2884 cells/uL (ref 850–3900)
MCH: 31.8 pg (ref 27.0–33.0)
MCHC: 32.8 g/dL (ref 32.0–36.0)
MCV: 96.9 fL (ref 80.0–100.0)
MPV: 10.5 fL (ref 7.5–12.5)
Monocytes Relative: 9.1 %
Neutro Abs: 4398 cells/uL (ref 1500–7800)
Neutrophils Relative %: 54.3 %
Platelets: 240 10*3/uL (ref 140–400)
RBC: 4.21 10*6/uL (ref 3.80–5.10)
RDW: 11.9 % (ref 11.0–15.0)
Total Lymphocyte: 35.6 %
WBC: 8.1 10*3/uL (ref 3.8–10.8)

## 2021-08-24 LAB — SEDIMENTATION RATE: Sed Rate: 2 mm/h (ref 0–20)

## 2021-08-24 LAB — RHEUMATOID FACTOR: Rhuematoid fact SerPl-aCnc: 30 IU/mL — ABNORMAL HIGH (ref <14)

## 2021-08-24 LAB — C-REACTIVE PROTEIN: CRP: 1 mg/L (ref <8.0)

## 2021-08-24 MED ORDER — HYDROXYCHLOROQUINE SULFATE 200 MG PO TABS
200.0000 mg | ORAL_TABLET | Freq: Every day | ORAL | 2 refills | Status: AC
Start: 2021-08-24 — End: ?

## 2021-08-25 ENCOUNTER — Ambulatory Visit: Payer: Medicaid Other | Admitting: Internal Medicine

## 2021-08-31 ENCOUNTER — Ambulatory Visit (INDEPENDENT_AMBULATORY_CARE_PROVIDER_SITE_OTHER): Payer: Medicaid Other

## 2021-08-31 ENCOUNTER — Encounter: Payer: Self-pay | Admitting: Podiatry

## 2021-08-31 ENCOUNTER — Ambulatory Visit (INDEPENDENT_AMBULATORY_CARE_PROVIDER_SITE_OTHER): Payer: Medicaid Other | Admitting: Podiatry

## 2021-08-31 ENCOUNTER — Other Ambulatory Visit: Payer: Self-pay

## 2021-08-31 DIAGNOSIS — M0579 Rheumatoid arthritis with rheumatoid factor of multiple sites without organ or systems involvement: Secondary | ICD-10-CM | POA: Diagnosis not present

## 2021-08-31 DIAGNOSIS — M7751 Other enthesopathy of right foot: Secondary | ICD-10-CM | POA: Diagnosis not present

## 2021-08-31 DIAGNOSIS — G5791 Unspecified mononeuropathy of right lower limb: Secondary | ICD-10-CM

## 2021-08-31 DIAGNOSIS — M797 Fibromyalgia: Secondary | ICD-10-CM

## 2021-08-31 DIAGNOSIS — M79671 Pain in right foot: Secondary | ICD-10-CM

## 2021-08-31 MED ORDER — METHYLPREDNISOLONE 4 MG PO TBPK: ORAL_TABLET | ORAL | 0 refills | Status: AC

## 2021-08-31 NOTE — Progress Notes (Signed)
?  Subjective:  ?Patient ID: Misty Huber, female    DOB: July 25, 1980,   MRN: [id number] ? ?Chief Complaint  ?Patient presents with  ? Foot Pain  ?  Right foot pain , constant pain throughout the whole foot , patient states nothing help relieve the pain.  ? ? ?41 y.o. female presents for concern of right foot midfoot arthritis and possible plantar fasciitis. Relates she gets constant right foot pain mainly under the right great toe and the heel. States it affect her walking. Relates it is sharp shocking shooting pain. States a few days ago she had worsening pain where it was warm red and tender to touch and has improved somewhat since then. She does have a history of rheumatoid arthritis and fibromyalgia.   . Denies any other pedal complaints. Denies n/v/f/c.  ? ?Past Medical History:  ?Diagnosis Date  ? Fibromyalgia 2018  ? Has had pain for years with fatigue but not diagnosed until year before implant out.    ? Obesity (BMI 30-39.9)   ? ? ?Objective:  ?Physical Exam: ?Vascular: DP/PT pulses 2/4 bilateral. CFT <3 seconds. Normal hair growth on digits. No edema.  ?Skin. No lacerations or abrasions bilateral feet.  ?Musculoskeletal: MMT 5/5 bilateral lower extremities in DF, PF, Inversion and Eversion. Deceased ROM in DF of ankle joint.  Exquisitely tender to first MPJ and mild pain with ROM of the joint. Positive tinel signs with palpation of superficial peroneal nerve along course proximally as well. Some pain to medial arch. Mildly tender to right medial calcaneal tubercle. No pain along Achilles. No pain along PT tendon and negative tinel to tibial nerve.  ?Neurological: Sensation intact to light touch.  ? ?Assessment:  ? ?1. Capsulitis of metatarsophalangeal (MTP) joint of right foot   ?2. Neuritis of right foot   ?3. Rheumatoid arthritis involving multiple sites with positive rheumatoid factor (HCC)   ?4. Fibromyalgia   ? ? ? ?Plan:  ?Patient was evaluated and treated and all questions answered. ?X-rays reviewed  and discussed with patient. No acute fractures or dislocations noted.  Mild degenerative changes noted to midfoot and first MPJ. Mild increased in HAV angle. Minimal spurring noted to plantar calcaneus.  ?-Notes reviewed from Rheumatology.  ?-Discussed diagnosis and treatment options for possible gout vs neuritis vs osteoarthritis compounded by RA and fibromyalgia and possible RA flares.  ?-Deferred injection today. Patient deferred at this time and will consider if any worsening of pain.  ?-Discussed anti-inflammatories. Medrol dose pack sent to pharmacy.  ?-Discussed diet and modifications.  ?-CAM boot provided for offloading when things flare.   ?-Patient to  monitor how she does and will follow-up if needed.  ? ? ? ?  ? ? ?Louann Sjogren, DPM  ? ? ?

## 2021-10-24 NOTE — Progress Notes (Deleted)
Office Visit Note  Patient: Misty Huber             Date of Birth: 12/04/1980           MRN: [id number]             PCP: Mack Hook, MD Referring: Mack Hook, MD Visit Date: 10/25/2021   Subjective:  No chief complaint on file.   History of Present Illness: Misty Huber is a 41 y.o. female here for follow up for suspected seropositive RA after trial of starting HCQ 200 mg daily. She saw Dr. Blenda Mounts wth evaluation showing capsulitis of the MTP joint. ***   Previous HPI 08/23/21 Misty Huber is a 41 y.o. female here for rheumatoid arthritis with positive RF. She was previously seen by Dr. Posey Pronto with original diagnosis in 2018 with right hand and elbow synovitis. She has also had joint pain in multiple areas and tender points consistent with fibromyalgia. She has had ongoing pain varying in intensity for about 5 years now since that time. Symptoms are particularly worse in the hips, knees, and feet right foot is the worst with frequent swelling around the ball of the foot. Morning stiffness for about 30-40 minutes after waking. Symptoms are particularly worse now in the past year and she is out of work unable to tolerated standing and walking all day and also typing due to hand pain. She notices occasionally dropping items unintentionally and grip strength is poor. Her legs also seem to lock up to partially give way unexpectedly but not causing any falls. She feels lightheadedness occasionally.  She is going for a sleep study to evaluate her fatigue and excessive sleepiness. She stopped taking methotrexate which caused abdominal pain, diarrhea, fatigue, and headaches not sure if it didn't work or just quit too early. She takes tylenol and ibuprofen as needed and uses turmeric as a dietary supplement for inflammation.   Labs reviewed 2018 RF 15.9 ANA neg CCP neg ESR 16 CRP 10.9   No Rheumatology ROS completed.   PMFS History:  Patient Active Problem List    Diagnosis Date Noted   Plantar fasciitis of right foot 08/23/2021   Nicotine dependence 10/22/2019   Anemia 02/16/2019   Menometrorrhagia 12/11/2018   Obesity (BMI 30-39.9)    Rheumatoid arthritis involving multiple sites with positive rheumatoid factor ([place]) 09/24/2016   Myalgia 09/24/2016   Long-term use of high-risk medication 09/24/2016   Fibromyalgia 2018    Past Medical History:  Diagnosis Date   Fibromyalgia 2018   Has had pain for years with fatigue but not diagnosed until year before implant out.     Obesity (BMI 30-39.9)     Family History  Problem Relation Age of Onset   Alcohol abuse Mother    HIV/AIDS Father        from IV drug use.   Past Surgical History:  Procedure Laterality Date   ABDOMINAL HYSTERECTOMY     cervical cryosurgery  1997   CESAREAN SECTION  2002, 2009   Social History   Social History Narrative   Lives in [place] with youngest 2 children   Immunization History  Administered Date(s) Administered   Tdap 03/19/2015     Objective: Vital Signs: LMP 12/09/2018    Physical Exam   Musculoskeletal Exam: ***  CDAI Exam: CDAI Score: -- Patient Global: --; Provider Global: -- Swollen: --; Tender: -- Joint Exam 10/25/2021   No joint exam has been documented for this visit   There is  currently no information documented on the homunculus. Go to the Rheumatology activity and complete the homunculus joint exam.  Investigation: No additional findings.  Imaging: No results found.  Recent Labs: Lab Results  Component Value Date   WBC 8.1 08/23/2021   HGB 13.4 08/23/2021   PLT 240 08/23/2021    Speciality Comments: No specialty comments available.  Procedures:  No procedures performed Allergies: Latex and Shellfish allergy   Assessment / Plan:     Visit Diagnoses: No diagnosis found.  ***  Orders: No orders of the defined types were placed in this encounter.  No orders of the defined types were placed in this  encounter.    Follow-Up Instructions: No follow-ups on file.   Collier Salina, MD  Note - This record has been created using [employer].  Chart creation errors have been sought, but may not always  have been located. Such creation errors do not reflect on  the standard of medical care.

## 2021-10-25 ENCOUNTER — Ambulatory Visit: Payer: Medicaid Other | Admitting: Internal Medicine

## 2021-10-25 NOTE — Progress Notes (Deleted)
 Office Visit Note  Patient: Misty Huber             Date of Birth: 05/24/1981           MRN: [id number]             PCP: Mulberry, Elizabeth, MD Referring: Mulberry, Elizabeth, MD Visit Date: 10/26/2021   Subjective:  No chief complaint on file.   History of Present Illness: Misty Huber is a 41 y.o. female here for follow up for suspected seropositive RA after starting hydroxychloroquine 200 mg daily. ***   Previous HPI 08/23/21 Misty Huber is a 41 y.o. female here for rheumatoid arthritis with positive RF. She was previously seen by Dr. Patel with original diagnosis in 2018 with right hand and elbow synovitis. She has also had joint pain in multiple areas and tender points consistent with fibromyalgia. She has had ongoing pain varying in intensity for about 5 years now since that time. Symptoms are particularly worse in the hips, knees, and feet right foot is the worst with frequent swelling around the ball of the foot. Morning stiffness for about 30-40 minutes after waking. Symptoms are particularly worse now in the past year and she is out of work unable to tolerated standing and walking all day and also typing due to hand pain. She notices occasionally dropping items unintentionally and grip strength is poor. Her legs also seem to lock up to partially give way unexpectedly but not causing any falls. She feels lightheadedness occasionally.  She is going for a sleep study to evaluate her fatigue and excessive sleepiness. She stopped taking methotrexate which caused abdominal pain, diarrhea, fatigue, and headaches not sure if it didn't work or just quit too early. She takes tylenol and ibuprofen as needed and uses turmeric as a dietary supplement for inflammation.   Labs reviewed 2018 RF 15.9 ANA neg CCP neg ESR 16 CRP 10.9   No Rheumatology ROS completed.   PMFS History:  Patient Active Problem List   Diagnosis Date Noted   Plantar fasciitis of right foot 08/23/2021    Nicotine dependence 10/22/2019   Anemia 02/16/2019   Menometrorrhagia 12/11/2018   Obesity (BMI 30-39.9)    Rheumatoid arthritis involving multiple sites with positive rheumatoid factor (HCC) 09/24/2016   Myalgia 09/24/2016   Long-term use of high-risk medication 09/24/2016   Fibromyalgia 2018    Past Medical History:  Diagnosis Date   Fibromyalgia 2018   Has had pain for years with fatigue but not diagnosed until year before implant out.     Obesity (BMI 30-39.9)     Family History  Problem Relation Age of Onset   Alcohol abuse Mother    HIV/AIDS Father        from IV drug use.   Past Surgical History:  Procedure Laterality Date   ABDOMINAL HYSTERECTOMY     cervical cryosurgery  1997   CESAREAN SECTION  2002, 2009   Social History   Social History Narrative   Lives in [place] with youngest 2 children   Immunization History  Administered Date(s) Administered   Tdap 03/19/2015     Objective: Vital Signs: LMP 12/09/2018    Physical Exam   Musculoskeletal Exam: ***  CDAI Exam: CDAI Score: -- Patient Global: --; Provider Global: -- Swollen: --; Tender: -- Joint Exam 10/26/2021   No joint exam has been documented for this visit   There is currently no information documented on the homunculus. Go to the Rheumatology activity and complete   the homunculus joint exam.  Investigation: No additional findings.  Imaging: No results found.  Recent Labs: Lab Results  Component Value Date   WBC 8.1 08/23/2021   HGB 13.4 08/23/2021   PLT 240 08/23/2021    Speciality Comments: No specialty comments available.  Procedures:  No procedures performed Allergies: Latex and Shellfish allergy   Assessment / Plan:     Visit Diagnoses: No diagnosis found.  ***  Orders: No orders of the defined types were placed in this encounter.  No orders of the defined types were placed in this encounter.    Follow-Up Instructions: No follow-ups on file.   Christopher  W Rice, MD  Note - This record has been created using Dragon software.  Chart creation errors have been sought, but may not always  have been located. Such creation errors do not reflect on  the standard of medical care.  

## 2021-10-26 ENCOUNTER — Ambulatory Visit: Payer: Medicaid Other | Admitting: Internal Medicine

## 2022-11-08 IMAGING — DX DG FOOT COMPLETE 3+V*R*
3 series · 3 of 3 positions shown · non-contrast
Comparison: None.

CLINICAL DATA: Chronic distal metatarsal foot pain mostly plantar
surface of first and second metatarsals.

EXAM:
RIGHT FOOT COMPLETE - 3+ VIEW

[foot ap wb]
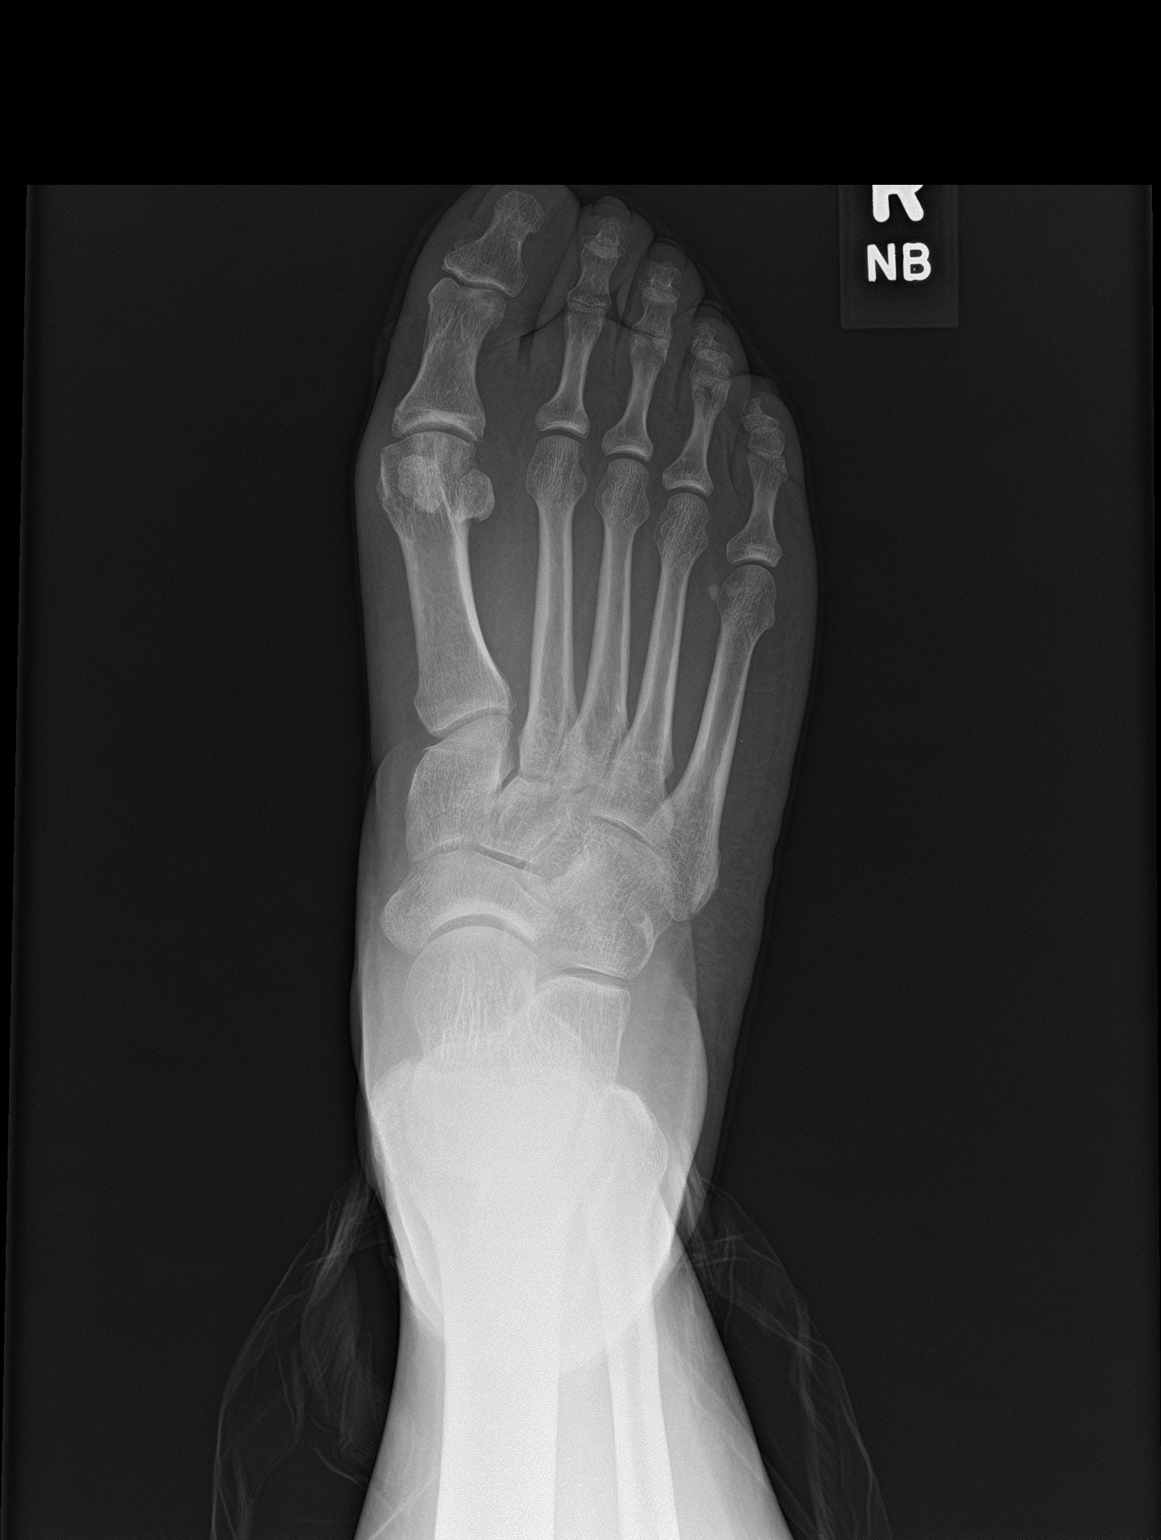

[foot obl wb]
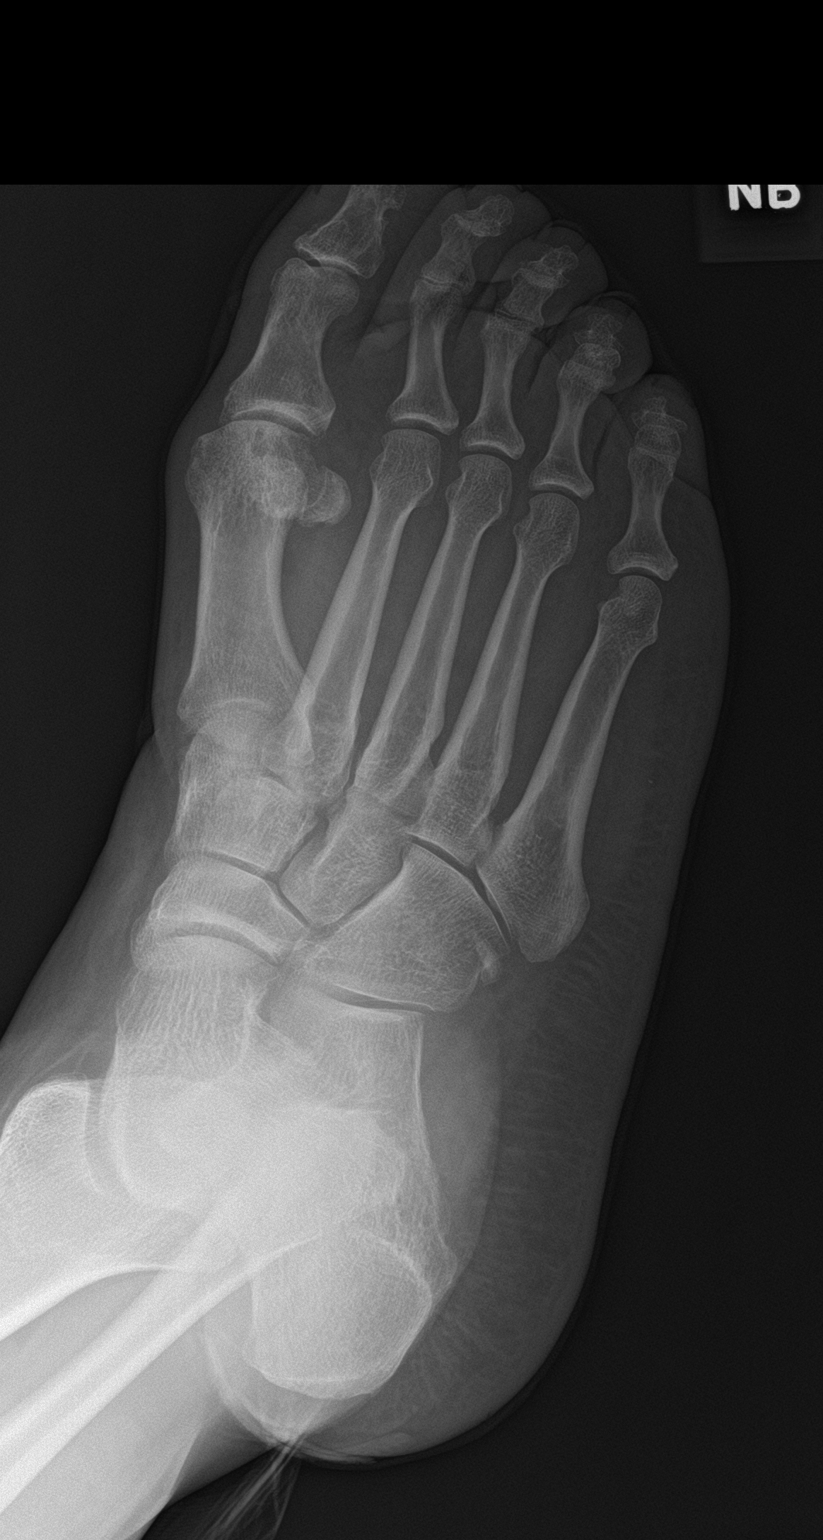

[foot lat wb]
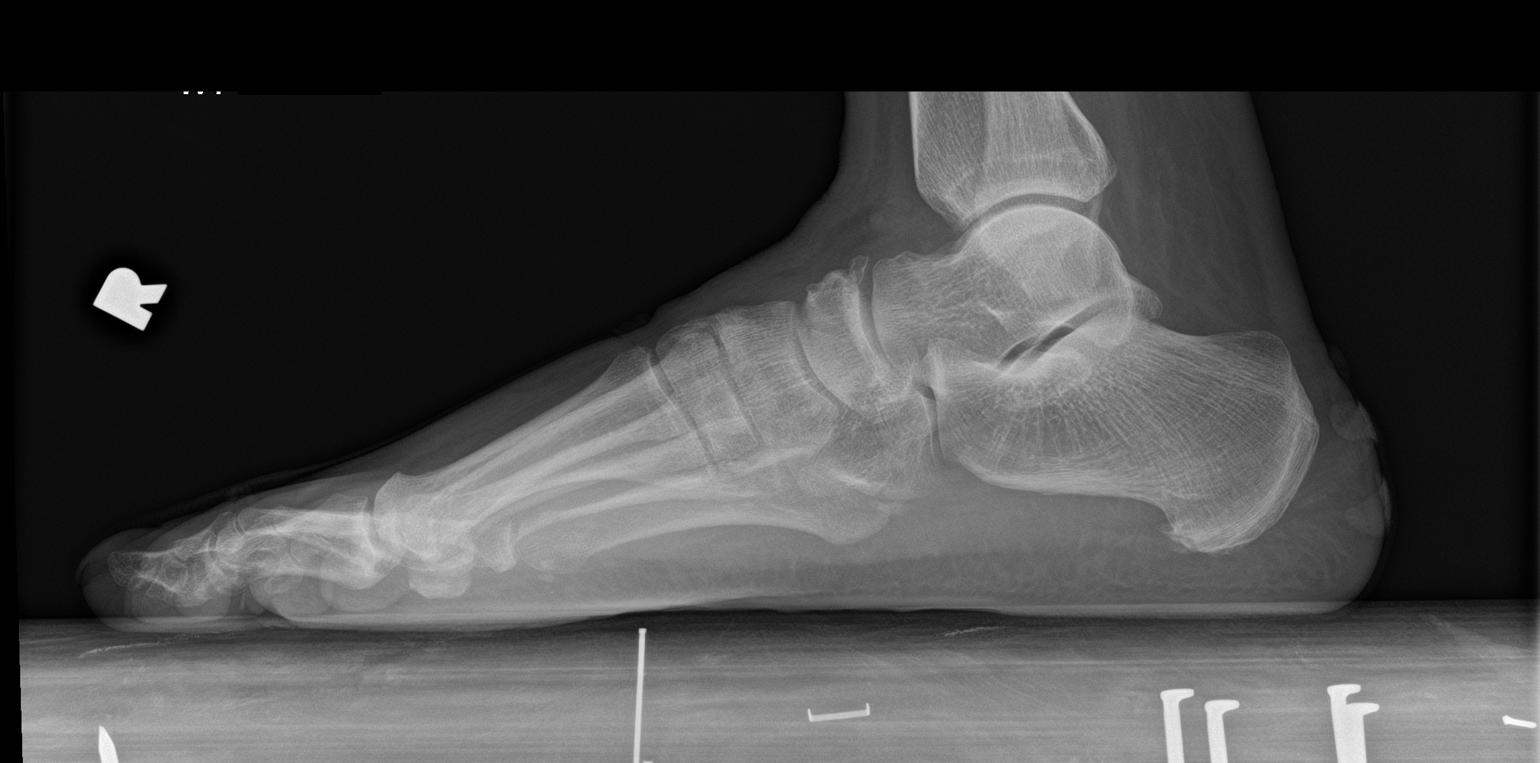

[3 of 3 positions shown; findings below may reference images not displayed]

FINDINGS: Mild hallux valgus. Minimal great toe metatarsophalangeal joint
space narrowing and peripheral osteophytosis degenerative change.
There is well corticated segmentation of the posterior dorsal aspect
of the navicular possibly a normal variant secondary ossification
center versus the sequela of remote trauma. Small calcaneal heel
spur. No acute fracture or dislocation.
IMPRESSION: Mild hallux valgus mild great toe metatarsophalangeal joint
osteoarthritis.
